# Patient Record
Sex: Male | Born: 2000 | Race: White | Hispanic: No | Marital: Single | Smoking: Never smoker
Health system: Southern US, Community
[De-identification: ages and names within clinical notes are randomized; demographics above are authoritative.]

---
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
## 2018-02-15 ENCOUNTER — Encounter: Payer: BLUE CROSS/BLUE SHIELD | Admitting: Family Medicine

## 2018-03-05 ENCOUNTER — Encounter: Payer: Self-pay | Admitting: Family Medicine

## 2018-03-05 ENCOUNTER — Ambulatory Visit (INDEPENDENT_AMBULATORY_CARE_PROVIDER_SITE_OTHER): Payer: BLUE CROSS/BLUE SHIELD | Admitting: Family Medicine

## 2018-03-05 VITALS — BP 140/62 | HR 69 | Ht 67.75 in | Wt 122.0 lb

## 2018-03-05 DIAGNOSIS — Z0001 Encounter for general adult medical examination with abnormal findings: Secondary | ICD-10-CM | POA: Diagnosis not present

## 2018-03-05 DIAGNOSIS — Z00129 Encounter for routine child health examination without abnormal findings: Secondary | ICD-10-CM

## 2018-03-05 DIAGNOSIS — R1013 Epigastric pain: Secondary | ICD-10-CM

## 2018-03-05 DIAGNOSIS — R110 Nausea: Secondary | ICD-10-CM | POA: Diagnosis not present

## 2018-03-05 DIAGNOSIS — R634 Abnormal weight loss: Secondary | ICD-10-CM | POA: Diagnosis not present

## 2018-03-05 NOTE — Progress Notes (Signed)
Subjective:     History was provided by the mother and father.  Billy Wilkerson is a 17 y.o. male who is here for this wellness visit.    Current Issues: Current concerns include:weight.  He did want to discuss his weight today.  He feels like he is too thin.  He says he eats normally but sometimes does not feel hungry and will skip with another days he is very hungry and will eat a lot.  He has not lost any weight but has not gained either.  He says he has several members in his family that are very slender but he just feels uncomfortable with it.  He wants to know what he could do to increase his weight.  He does also report some occasional nausea and frequent burping.  Sometimes he will wake up with a nausea and then not want to eat breakfast.  He does drink a lot of carbonated beverages.  Occasionally he will have pain between his spine and his right shoulder blade and below that right shoulder blade but really feels like it is more muscular because if he turns or twists his neck he can feel the uncomfortableness of it.  H (Home) Family Relationships: good Communication: good with parents Responsibilities: has responsibilities at home  E (Education): Grades: N/A School: [employer] Future Plans: college - transfer to college in [place].   A (Activities) Sports: no sports Exercise: No Friends: Yes   A (Auton/Safety) Auto: wears seat belt   D (Diet) Diet: balanced diet Risky eating habits: nausae and reflux but does drink alot of soda.  Intake: irregular intake.  Body Image: positive body image, feels he is too thin  Drugs Tobacco: No Alcohol: not asked   Sex Activity: not asked  Suicide Risk Emotions: healthy Depression: denies feelings of depression Suicidal: denies suicidal ideation     Objective:     Vitals:   03/05/18 1333  BP: 140/62  Pulse: 69  SpO2: 99%  Weight: 122 lb (55.3 kg)  Height: 5' 7.75" (1.721 m)   Growth parameters are noted and are appropriate  for age.  General:   alert and cooperative  Gait:   normal  Skin:   normal  Oral cavity:   lips, mucosa, and tongue normal; teeth and gums normal  Eyes:   sclerae white, pupils equal and reactive  Ears:   normal bilaterally  Neck:   normal  Lungs:  clear to auscultation bilaterally  Heart:   regular rate and rhythm, S1, S2 normal, no murmur, click, rub or gallop  Abdomen:  soft, non-tender; bowel sounds normal; no masses,  no organomegaly  GU:  not examined  Extremities:   extremities normal, atraumatic, no cyanosis or edema  Neuro:  normal without focal findings, mental status, speech normal, alert and oriented x3, PERLA and reflexes normal and symmetric     Assessment:    Healthy 17 y.o. male child.    Plan:   1. Anticipatory guidance discussed. Nutrition and Physical activity  2. Follow-up visit in 12 months for next wellness visit, or sooner as needed.    3. Nausea and burping - likely GERD.  Discussed cutting back on soda which he drinks a lot of.  He is Artie cut back on his coffee some.  Avoiding acidic and greasy foods.  Handout provided with additional information.  Recommend a trial of PPI at nighttime since his symptoms been seemed to trigger more in the morning.  4. Abnromal weight loss -  we discussed working this up further by at least doing a CBC to evaluate for abnormal white count and anemia in addition to checking his thyroid though I suspect that this will all be normal.  No significant family history of thyroid disorder.  Recommended a trial of using the smart phone application call my fitness pal to help him actually track calories to make sure that he is getting adequate intake and to may be even set a caloric goal so that he could gain 1-2 pounds per month.  If that is not improving over the summer he is welcome to come back and we can always discuss a low dose of Paxil before he goes to college.  Also discussed current vaccination recommendations.  I would  encourage him to get his meningitis vaccine.  We also discussed hepatitis A but is not required by school and also consider Gardisil.

## 2018-03-05 NOTE — Patient Instructions (Addendum)
My Fitness Pal can help you track calories Can try Prilosec OTC at bedtime for 2 weeks and see if helping your nausea Work on cutting out the soda.    Food Choices for Gastroesophageal Reflux Disease, Adult When you have gastroesophageal reflux disease (GERD), the foods you eat and your eating habits are very important. Choosing the right foods can help ease your discomfort. What guidelines do I need to follow?  Choose fruits, vegetables, whole grains, and low-fat dairy products.  Choose low-fat meat, fish, and poultry.  Limit fats such as oils, salad dressings, butter, nuts, and avocado.  Keep a food diary. This helps you identify foods that cause symptoms.  Avoid foods that cause symptoms. These may be different for everyone.  Eat small meals often instead of 3 large meals a day.  Eat your meals slowly, in a place where you are relaxed.  Limit fried foods.  Cook foods using methods other than frying.  Avoid drinking alcohol.  Avoid drinking large amounts of liquids with your meals.  Avoid bending over or lying down until 2-3 hours after eating. What foods are not recommended? These are some foods and drinks that may make your symptoms worse: Vegetables Tomatoes. Tomato juice. Tomato and spaghetti sauce. Chili peppers. Onion and garlic. Horseradish. Fruits Oranges, grapefruit, and lemon (fruit and juice). Meats High-fat meats, fish, and poultry. This includes hot dogs, ribs, ham, sausage, salami, and bacon. Dairy Whole milk and chocolate milk. Sour cream. Cream. Butter. Ice cream. Cream cheese. Drinks Coffee and tea. Bubbly (carbonated) drinks or energy drinks. Condiments Hot sauce. Barbecue sauce. Sweets/Desserts Chocolate and cocoa. Donuts. Peppermint and spearmint. Fats and Oils High-fat foods. This includes [place] fries and potato chips. Other Vinegar. Strong spices. This includes black pepper, white pepper, red pepper, cayenne, curry powder, cloves, ginger,  and chili powder. The items listed above may not be a complete list of foods and drinks to avoid. Contact your dietitian for more information. This information is not intended to replace advice given to you by your health care provider. Make sure you discuss any questions you have with your health care provider. Document Released: 06/12/2012 Document Revised: 05/19/2016 Document Reviewed: 10/16/2013 Elsevier Interactive Patient Education  2017 [employer]

## 2018-03-06 LAB — CBC
HEMATOCRIT: 45.6 % (ref 36.0–49.0)
Hemoglobin: 16.1 g/dL (ref 12.0–16.9)
MCH: 29 pg (ref 25.0–35.0)
MCHC: 35.3 g/dL (ref 31.0–36.0)
MCV: 82 fL (ref 78.0–98.0)
MPV: 10 fL (ref 7.5–12.5)
Platelets: 271 10*3/uL (ref 140–400)
RBC: 5.56 10*6/uL (ref 4.10–5.70)
RDW: 12.7 % (ref 11.0–15.0)
WBC: 5.9 10*3/uL (ref 4.5–13.0)

## 2018-03-06 LAB — TSH: TSH: 1.11 mIU/L (ref 0.50–4.30)

## 2018-03-06 NOTE — Progress Notes (Signed)
All labs are normal. 

## 2018-03-20 ENCOUNTER — Encounter: Payer: Self-pay | Admitting: Family Medicine

## 2018-03-20 ENCOUNTER — Ambulatory Visit (INDEPENDENT_AMBULATORY_CARE_PROVIDER_SITE_OTHER): Payer: BLUE CROSS/BLUE SHIELD | Admitting: Family Medicine

## 2018-03-20 VITALS — BP 129/52 | HR 67 | Ht 68.0 in | Wt 120.0 lb

## 2018-03-20 DIAGNOSIS — J0130 Acute sphenoidal sinusitis, unspecified: Secondary | ICD-10-CM

## 2018-03-20 MED ORDER — AMOXICILLIN-POT CLAVULANATE 875-125 MG PO TABS: 1.0000 | ORAL_TABLET | Freq: Two times a day (BID) | ORAL | 0 refills | Status: DC

## 2018-03-20 NOTE — Progress Notes (Signed)
   Subjective:    Patient ID: Billy Wilkerson, male    DOB: 09/25/2001, 17 y.o.   MRN: [id number]  HPI 17 year old male is here today complaining of 1 week of symptoms including sore throat, facial pressure.  He initially had a low-grade temperature last Tuesday around 99.4.  He is been using Sudafed and waking up with a dry throat.  He is feeling some better though in regards to the sore throat and headache but now he just feels very congested and having pain over the nasal bridge. His voice is very raspy and he did not stop the Flonase because he says it was causing his throat to get irritated.  Review of Systems     Objective:   Physical Exam  Constitutional: He is oriented to person, place, and time. He appears well-developed and well-nourished.  HENT:  Head: Normocephalic and atraumatic.  Right Ear: External ear normal.  Left Ear: External ear normal.  Nose: Nose normal.  Mouth/Throat: Oropharynx is clear and moist.  TMs and canals are clear.   Eyes: Pupils are equal, round, and reactive to light. Conjunctivae and EOM are normal.  Neck: Neck supple. No thyromegaly present.  Cardiovascular: Normal rate and normal heart sounds.  Pulmonary/Chest: Effort normal and breath sounds normal.  Lymphadenopathy:    He has no cervical adenopathy.  Neurological: He is alert and oriented to person, place, and time.  Skin: Skin is warm and dry.  Psychiatric: He has a normal mood and affect.          Assessment & Plan:  Sinusitis, acute -treat with Augmentin.  It sounds like it was likely a viral infection and now has turned more into a sinus infection.  If not significantly better in 1 week then please give us a call back.  Can certainly use an oral antihistamine instead of the Flonase.  Though if the Flonase is hitting the back of his throat then he is probably not tilting his head forward and enough and I briefly reviewed technique again with him.

## 2018-03-20 NOTE — Patient Instructions (Signed)

## 2019-09-16 ENCOUNTER — Telehealth: Payer: Self-pay

## 2019-09-16 NOTE — Telephone Encounter (Signed)
Billy Wilkerson's mom called to ask if he could take ibuprofen. He has COVID-19 and is on day 13. No fever, he does report body aches, sinus pressure and congestion. Please advise.

## 2019-09-16 NOTE — Telephone Encounter (Signed)
Yes, he can take ibuprofen and or Tylenol or even alternate if needed.

## 2019-09-17 MED ORDER — FLUTICASONE PROPIONATE 50 MCG/ACT NA SUSP: 1.0000 | Freq: Every day | NASAL | 5 refills | Status: DC

## 2019-09-17 NOTE — Telephone Encounter (Signed)
Billy Wilkerson's mom is requesting a refill on Flonase. Historical provider.

## 2019-09-17 NOTE — Telephone Encounter (Signed)
Patient's mom advised 

## 2019-09-17 NOTE — Telephone Encounter (Signed)
rx sent

## 2020-04-15 ENCOUNTER — Other Ambulatory Visit: Payer: Self-pay

## 2020-04-15 MED ORDER — FLUTICASONE PROPIONATE 50 MCG/ACT NA SUSP: 1.0000 | Freq: Every day | NASAL | 1 refills | Status: DC

## 2020-04-16 ENCOUNTER — Telehealth: Payer: BLUE CROSS/BLUE SHIELD | Admitting: Nurse Practitioner

## 2020-04-28 ENCOUNTER — Other Ambulatory Visit: Payer: Self-pay

## 2020-04-28 ENCOUNTER — Encounter: Payer: Self-pay | Admitting: Family Medicine

## 2020-04-28 ENCOUNTER — Telehealth (INDEPENDENT_AMBULATORY_CARE_PROVIDER_SITE_OTHER): Payer: BC Managed Care – PPO | Admitting: Family Medicine

## 2020-04-28 VITALS — Ht 68.0 in | Wt 124.0 lb

## 2020-04-28 DIAGNOSIS — J069 Acute upper respiratory infection, unspecified: Secondary | ICD-10-CM

## 2020-04-28 DIAGNOSIS — R059 Cough, unspecified: Secondary | ICD-10-CM

## 2020-04-28 DIAGNOSIS — R05 Cough: Secondary | ICD-10-CM

## 2020-04-28 DIAGNOSIS — R0602 Shortness of breath: Secondary | ICD-10-CM

## 2020-04-28 NOTE — Progress Notes (Signed)
Virtual Visit via Video Note  I connected with Billy Wilkerson on 04/28/20 at  3:20 PM EDT by a video enabled telemedicine application and verified that I am speaking with the correct person using two identifiers.   I discussed the limitations of evaluation and management by telemedicine and the availability of in person appointments. The patient expressed understanding and agreed to proceed.  Subjective:    CC: Cough  HPI:  Billy Wilkerson is a 19 year old male with no prior history of respiratory problems who complains of sinus symptoms and cough for about 2 weeks.  He says about 2 weeks ago he started noticing some sinus drainage mild congestion and just itchy irritated scratchy throat.  Then about a week ago he developed a cough.  He feels like the cough is mostly in the upper respiratory airway does not feel like it is deep in his chest.  He denies any fevers Chills or sweats.  He did go get tested for strep and Covid and it was negative.  He did feel like initially it was may be more allergy related he did start some Flonase but says it actually made his cough worse.  And he has been taking Claritin though he did miss a couple days of it because he ran out.  He also reports he is been taking some Delsym as well Sudafed.  He also wanted to let me know that he did have Covid in September.  About 4 to 5 days after he was diagnosed he actually had increased shortness of breath and went to the emergency department.  He was given an albuterol inhaler there and he says he has been using it periodically in fact he used it yesterday because he was coughing so much he actually felt short of breath.  He says that ever since he had Covid he is just noticed he is felt more short of breath in general than usual no prior history of asthma as a child  Past medical history, Surgical history, Family history not pertinant except as noted below, Social history, Allergies, and medications have been entered into the medical  record, reviewed, and corrections made.   Review of Systems: No fevers, chills, night sweats, weight loss, chest pain, or shortness of breath.   Objective:    General: Speaking clearly in complete sentences without any shortness of breath.  Alert and oriented x3.  Normal judgment. No apparent acute distress.    Impression and Recommendations:    No problem-specific Assessment & Plan notes found for this encounter.  Cough-I feel like it secondary to either upper respiratory infection and or allergies.  He actually feels like it is gradually gotten a little bit better and in the last couple days it has improved slightly.  So we discussed continuing with his current regimen of oral antihistamine.  I reviewed how to properly use a nasal steroid spray.  He was actually tilting his head backwards which was what was causing the cough.  Discussed how to do the a chin tuck and administer the medication.  URI-also consider may be a viral syndrome.  Should improve in the next week.  If he feels like he is suddenly getting worse or just not improving then consider treating for bacterial sinusitis.  Shortness of breath since having had Covid in September.  I discussed with him getting him set up for spirometry for further evaluation once he is over his current respiratory illness.   Time spent in encounter 22 minutes  I  discussed the assessment and treatment plan with the patient. The patient was provided an opportunity to ask questions and all were answered. The patient agreed with the plan and demonstrated an understanding of the instructions.   The patient was advised to call back or seek an in-person evaluation if the symptoms worsen or if the condition fails to improve as anticipated.   Nani Gasser, MD

## 2020-04-28 NOTE — Progress Notes (Signed)
Pt reports that he was taking delsym at night. He said he doesn't have it that often and this has gotten better over the last week. He reports that the cough is worse in the mornings and at night.  He reports that he stopped using the Flonase a few days ago due to it causing his cough to be worse.   He was tested a couple of weeks ago for covid,flu and strep and was negative for all three.

## 2020-04-29 ENCOUNTER — Telehealth: Payer: BLUE CROSS/BLUE SHIELD | Admitting: Medical-Surgical

## 2020-05-07 ENCOUNTER — Ambulatory Visit (INDEPENDENT_AMBULATORY_CARE_PROVIDER_SITE_OTHER): Payer: BC Managed Care – PPO | Admitting: Family Medicine

## 2020-05-07 ENCOUNTER — Other Ambulatory Visit: Payer: Self-pay

## 2020-05-07 ENCOUNTER — Encounter: Payer: Self-pay | Admitting: Family Medicine

## 2020-05-07 VITALS — BP 125/71 | HR 83 | Ht 68.0 in | Wt 128.0 lb

## 2020-05-07 DIAGNOSIS — K120 Recurrent oral aphthae: Secondary | ICD-10-CM

## 2020-05-07 DIAGNOSIS — Z0000 Encounter for general adult medical examination without abnormal findings: Secondary | ICD-10-CM | POA: Diagnosis not present

## 2020-05-07 LAB — LIPID PANEL
Cholesterol: 181 mg/dL (ref <200)
HDL: 54 mg/dL (ref >=40)
LDL Cholesterol (Calc): 111 mg/dL (calc) — ABNORMAL HIGH
Non-HDL Cholesterol (Calc): 127 mg/dL (calc) (ref <130)
Total CHOL/HDL Ratio: 3.4 (calc) (ref <5.0)
Triglycerides: 73 mg/dL (ref <150)

## 2020-05-07 LAB — COMPLETE METABOLIC PANEL WITH GFR
AG Ratio: 1.8 (calc) (ref 1.0–2.5)
ALT: 57 U/L — ABNORMAL HIGH (ref 9–46)
AST: 45 U/L — ABNORMAL HIGH (ref 10–40)
Albumin: 4.9 g/dL (ref 3.6–5.1)
Alkaline phosphatase (APISO): 61 U/L (ref 36–130)
BUN: 13 mg/dL (ref 7–25)
CO2: 31 mmol/L (ref 20–32)
Calcium: 9.8 mg/dL (ref 8.6–10.3)
Chloride: 101 mmol/L (ref 98–110)
Creat: 0.82 mg/dL (ref 0.60–1.35)
GFR, Est African American: 148 mL/min/{1.73_m2} (ref >=60)
GFR, Est Non African American: 127 mL/min/{1.73_m2} (ref >=60)
Globulin: 2.8 g/dL (calc) (ref 1.9–3.7)
Glucose, Bld: 92 mg/dL (ref 65–139)
Potassium: 4.5 mmol/L (ref 3.5–5.3)
Sodium: 138 mmol/L (ref 135–146)
Total Bilirubin: 0.7 mg/dL (ref 0.2–1.2)
Total Protein: 7.7 g/dL (ref 6.1–8.1)

## 2020-05-07 LAB — CBC
HCT: 47.5 % (ref 38.5–50.0)
Hemoglobin: 16.3 g/dL (ref 13.2–17.1)
MCH: 28.6 pg (ref 27.0–33.0)
MCHC: 34.3 g/dL (ref 32.0–36.0)
MCV: 83.5 fL (ref 80.0–100.0)
MPV: 9.7 fL (ref 7.5–12.5)
Platelets: 315 10*3/uL (ref 140–400)
RBC: 5.69 10*6/uL (ref 4.20–5.80)
RDW: 12.6 % (ref 11.0–15.0)
WBC: 4.9 10*3/uL (ref 3.8–10.8)

## 2020-05-07 NOTE — Patient Instructions (Addendum)
Recommend a trial of lysine 500 mg daily to help with the mouth ulcers.       Preventive Care 58-21 Years Old, Male Preventive care refers to lifestyle choices and visits with your health care provider that can promote health and wellness. At this stage in your life, you may start seeing a primary care physician instead of a pediatrician. Your health care is now your responsibility. Preventive care for young adults includes:  A yearly physical exam. This is also called an annual wellness visit.  Regular dental and eye exams.  Immunizations.  Screening for certain conditions.  Healthy lifestyle choices, such as diet and exercise. What can I expect for my preventive care visit? Physical exam Your health care provider may check:  Height and weight. These may be used to calculate body mass index (BMI), which is a measurement that tells if you are at a healthy weight.  Heart rate and blood pressure.  Body temperature. Counseling Your health care provider may ask you questions about:  Past medical problems and family medical history.  Alcohol, tobacco, and drug use.  Home and relationship well-being.  Access to firearms.  Emotional well-being.  Diet, exercise, and sleep habits.  Sexual activity and sexual health. What immunizations do I need?  Influenza (flu) vaccine  This is recommended every year. Tetanus, diphtheria, and pertussis (Tdap) vaccine  You may need a Td booster every 10 years. Varicella (chickenpox) vaccine  You may need this vaccine if you have not already been vaccinated. Human papillomavirus (HPV) vaccine  If recommended by your health care provider, you may need three doses over 6 months. Measles, mumps, and rubella (MMR) vaccine  You may need at least one dose of MMR. You may also need a second dose. Meningococcal conjugate (MenACWY) vaccine  One dose is recommended if you are 3-47 years old and a Market researcher living in a  residence hall, or if you have one of several medical conditions. You may also need additional booster doses. Pneumococcal conjugate (PCV13) vaccine  You may need this if you have certain conditions and were not previously vaccinated. Pneumococcal polysaccharide (PPSV23) vaccine  You may need one or two doses if you smoke cigarettes or if you have certain conditions. Hepatitis A vaccine  You may need this if you have certain conditions or if you travel or work in places where you may be exposed to hepatitis A. Hepatitis B vaccine  You may need this if you have certain conditions or if you travel or work in places where you may be exposed to hepatitis B. Haemophilus influenzae type b (Hib) vaccine  You may need this if you have certain risk factors. You may receive vaccines as individual doses or as more than one vaccine together in one shot (combination vaccines). Talk with your health care provider about the risks and benefits of combination vaccines. What tests do I need? Blood tests  Lipid and cholesterol levels. These may be checked every 5 years starting at age 85.  Hepatitis C test.  Hepatitis B test. Screening  Genital exam to check for testicular cancer or hernias.  Sexually transmitted disease (STD) testing, if you are at risk. Other tests  Tuberculosis skin test.  Vision and hearing tests.  Skin exam. Follow these instructions at home: Eating and drinking   Eat a diet that includes fresh fruits and vegetables, whole grains, lean protein, and low-fat dairy products.  Drink enough fluid to keep your urine pale yellow.  Do not  drink alcohol if: ? Your health care provider tells you not to drink. ? You are under the legal drinking age. In the [place] the legal drinking age is 37.  If you drink alcohol: ? Limit how much you have to 0-2 drinks a day. ? Be aware of how much alcohol is in your drink. In the [place] one drink equals one 12 oz bottle of beer (355 mL),  one 5 oz glass of wine (148 mL), or one 1 oz glass of hard liquor (44 mL). Lifestyle  Take daily care of your teeth and gums.  Stay active. Exercise at least 30 minutes 5 or more days of the week.  Do not use any products that contain nicotine or tobacco, such as cigarettes, e-cigarettes, and chewing tobacco. If you need help quitting, ask your health care provider.  Do not use drugs.  If you are sexually active, practice safe sex. Use a condom or other form of protection to prevent STIs (sexually transmitted infections).  Find healthy ways to cope with stress, such as: ? Meditation, yoga, or listening to music. ? Journaling. ? Talking to a trusted person. ? Spending time with friends and family. Safety  Always wear your seat belt while driving or riding in a vehicle.  Do not drive if you have been drinking alcohol.  Do not ride with someone who has been drinking.  Do not drive when you are tired or distracted.  Do not text while driving.  Wear a helmet and other protective equipment during sports activities.  If you have firearms in your house, make sure you follow all gun safety procedures.  Seek help if you have been bullied, physically abused, or sexually abused.  Use the Internet responsibly to avoid dangers such as online bullying and online sex predators. What's next?  Go to your health care provider once a year for a well check visit.  Ask your health care provider how often you should have your eyes and teeth checked.  Stay up to date on all vaccines. This information is not intended to replace advice given to you by your health care provider. Make sure you discuss any questions you have with your health care provider. Document Revised: 12/06/2018 Document Reviewed: 12/06/2018 Elsevier Patient Education  2020 [employer].

## 2020-05-07 NOTE — Assessment & Plan Note (Signed)
Recommend trial of Lysine 500mg  QD.

## 2020-05-07 NOTE — Progress Notes (Signed)
CPE  Established Patient Office Visit  Subjective:  Patient ID: Billy Wilkerson, male    DOB: 04-Nov-2001  Age: 19 y.o. MRN: [id number]  CC:  Chief Complaint  Patient presents with  . Annual Exam    HPI Billy Wilkerson presents for CPE. He is doing well.  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  Would like to get the COVID vaccine.  Gets recurrent aphthous ulcers every few weeks.  Says usually on has 1 at a time.    History reviewed. No pertinent past medical history.  History reviewed. No pertinent surgical history.  History reviewed. No pertinent family history.  Social History   Socioeconomic History  . Marital status: Single    Spouse name: Not on file  . Number of children: Not on file  . Years of education: Not on file  . Highest education level: Not on file  Occupational History  . Not on file  Tobacco Use  . Smoking status: Never Smoker  . Smokeless tobacco: Never Used  Substance and Sexual Activity  . Alcohol use: Not on file  . Drug use: Not on file  . Sexual activity: Not on file  Other Topics Concern  . Not on file  Social History Narrative  . Not on file   Social Determinants of Health   Financial Resource Strain:   . Difficulty of Paying Living Expenses:   Food Insecurity:   . Worried About Charity fundraiser in the Last Year:   . Arboriculturist in the Last Year:   Transportation Needs:   . Film/video editor (Medical):   Marland Kitchen Lack of Transportation (Non-Medical):   Physical Activity:   . Days of Exercise per Week:   . Minutes of Exercise per Session:   Stress:   . Feeling of Stress :   Social Connections:   . Frequency of Communication with Friends and Family:   . Frequency of Social Gatherings with Friends and Family:   . Attends Religious Services:   . Active Member of Clubs or Organizations:   . Attends Archivist Meetings:   Marland Kitchen Marital Status:   Intimate Partner Violence:   . Fear of  Current or Ex-Partner:   . Emotionally Abused:   Marland Kitchen Physically Abused:   . Sexually Abused:     Outpatient Medications Prior to Visit  Medication Sig Dispense Refill  . fluticasone (FLONASE) 50 MCG/ACT nasal spray Place 1 spray into both nostrils daily. 18.2 mL 1   No facility-administered medications prior to visit.    No Known Allergies  ROS Review of Systems    Objective:    Physical Exam  Constitutional: He is oriented to person, place, and time. He appears well-developed and well-nourished.  HENT:  Head: Normocephalic and atraumatic.  Right Ear: External ear normal.  Left Ear: External ear normal.  Nose: Nose normal.  Mouth/Throat: Oropharynx is clear and moist.  Eyes: Pupils are equal, round, and reactive to light. Conjunctivae and EOM are normal.  Neck: No thyromegaly present.  Cardiovascular: Normal rate, regular rhythm, normal heart sounds and intact distal pulses.  Pulmonary/Chest: Effort normal and breath sounds normal.  Abdominal: Soft. Bowel sounds are normal. He exhibits no distension and no mass. There is no abdominal tenderness. There is no rebound and no guarding.  Musculoskeletal:        General: Normal range of motion.     Cervical back: Normal range of motion  and neck supple.  Lymphadenopathy:    He has no cervical adenopathy.  Neurological: He is alert and oriented to person, place, and time. He has normal reflexes.  Skin: Skin is warm and dry.  Psychiatric: He has a normal mood and affect. His behavior is normal. Judgment and thought content normal.    BP 125/71   Pulse 83   Ht '5\' 8"'  (1.727 m)   Wt 128 lb (58.1 kg)   SpO2 100%   BMI 19.46 kg/m  Wt Readings from Last 3 Encounters:  05/07/20 128 lb (58.1 kg)  04/28/20 124 lb (56.2 kg)  03/20/18 120 lb (54.4 kg) (5 %, Z= -1.62)*   * Growth percentiles are based on CDC (Boys, 2-20 Years) data.     There are no preventive care reminders to display for this patient.  There are no preventive  care reminders to display for this patient.  Lab Results  Component Value Date   TSH 1.11 03/05/2018   Lab Results  Component Value Date   WBC 5.9 03/05/2018   HGB 16.1 03/05/2018   HCT 45.6 03/05/2018   MCV 82.0 03/05/2018   PLT 271 03/05/2018   No results found for: NA, K, CHLORIDE, CO2, GLUCOSE, BUN, CREATININE, BILITOT, ALKPHOS, AST, ALT, PROT, ALBUMIN, CALCIUM, ANIONGAP, EGFR, GFR No results found for: CHOL No results found for: HDL No results found for: LDLCALC No results found for: TRIG No results found for: CHOLHDL No results found for: HGBA1C    Assessment & Plan:   Problem List Items Addressed This Visit      Other   Aphthous ulcer    Recommend trial of Lysine 5109m QD.         Other Visit Diagnoses    Wellness examination    -  Primary   Relevant Orders   COMPLETE METABOLIC PANEL WITH GFR   Lipid panel   CBC     Keep up a regular exercise program and make sure you are eating a healthy diet Try to eat 4 servings of dairy a day, or if you are lactose intolerant take a calcium with vitamin D daily.  Your vaccines are up to date.  Given info for walk-in for COVID vaccines.    No orders of the defined types were placed in this encounter.   Follow-up: Return in about 1 year (around 05/07/2021) for Wellness Exam.    CBeatrice Lecher MD

## 2020-05-08 ENCOUNTER — Encounter: Payer: Self-pay | Admitting: Family Medicine

## 2020-05-08 DIAGNOSIS — R748 Abnormal levels of other serum enzymes: Secondary | ICD-10-CM

## 2020-05-08 NOTE — Telephone Encounter (Signed)
Routing to provider/medical assistant.  

## 2020-05-08 NOTE — Telephone Encounter (Signed)
Routing to provider  

## 2020-05-11 NOTE — Telephone Encounter (Signed)
Per labs "Your metabolic panel looks okay except your liver enzymes are mildly elevated. This is a little bit unusual but can be caused by a viral illness or if you have been taking a lot of Tylenol or drink alcohol. Lets recheck your enzymes in about 2 weeks I want to definitely do that before you leave town. [employer] ordered

## 2020-05-14 ENCOUNTER — Ambulatory Visit (INDEPENDENT_AMBULATORY_CARE_PROVIDER_SITE_OTHER): Payer: BC Managed Care – PPO | Admitting: Family Medicine

## 2020-05-14 ENCOUNTER — Other Ambulatory Visit: Payer: Self-pay

## 2020-05-14 ENCOUNTER — Encounter: Payer: Self-pay | Admitting: Family Medicine

## 2020-05-14 VITALS — BP 131/74 | HR 64 | Ht 68.0 in | Wt 128.0 lb

## 2020-05-14 DIAGNOSIS — R0602 Shortness of breath: Secondary | ICD-10-CM

## 2020-05-14 MED ORDER — ALBUTEROL SULFATE HFA 108 (90 BASE) MCG/ACT IN AERS
2.0000 | INHALATION_SPRAY | Freq: Once | RESPIRATORY_TRACT | Status: AC
  Administered 2020-05-14: 2 via RESPIRATORY_TRACT

## 2020-05-14 NOTE — Progress Notes (Signed)
Established Patient Office Visit  Subjective:  Patient ID: Billy Wilkerson, male    DOB: [birth year]  Age: 19 y.o. MRN: [id number]  CC:  Chief Complaint  Patient presents with  . Shortness of Breath    HPI Billy Wilkerson presents for f/U SOB.  Had notice more SOB since had COVID back in September.  He reports that he did have a chest x-ray done right after he got a quarantine because he went hiking and felt really short of breath and ended up going to the emergency room says he was told it was normal and given albuterol inhaler to use as needed.  Ever since then he just noticed a little bit of increase shortness of breath with activity but nothing severe.  He occasionally will use the albuterol that he was given.  He is here today for spirometry  No past medical history on file.  No past surgical history on file.  No family history on file.  Social History   Socioeconomic History  . Marital status: Single    Spouse name: Not on file  . Number of children: Not on file  . Years of education: Not on file  . Highest education level: Not on file  Occupational History  . Not on file  Tobacco Use  . Smoking status: Never Smoker  . Smokeless tobacco: Never Used  Substance and Sexual Activity  . Alcohol use: Not on file  . Drug use: Not on file  . Sexual activity: Not on file  Other Topics Concern  . Not on file  Social History Narrative  . Not on file   Social Determinants of Health   Financial Resource Strain:   . Difficulty of Paying Living Expenses:   Food Insecurity:   . Worried About Programme researcher, broadcasting/film/video in the Last Year:   . Barista in the Last Year:   Transportation Needs:   . Freight forwarder (Medical):   Marland Kitchen Lack of Transportation (Non-Medical):   Physical Activity:   . Days of Exercise per Week:   . Minutes of Exercise per Session:   Stress:   . Feeling of Stress :   Social Connections:   . Frequency of Communication with Friends and  Family:   . Frequency of Social Gatherings with Friends and Family:   . Attends Religious Services:   . Active Member of Clubs or Organizations:   . Attends Banker Meetings:   Marland Kitchen Marital Status:   Intimate Partner Violence:   . Fear of Current or Ex-Partner:   . Emotionally Abused:   Marland Kitchen Physically Abused:   . Sexually Abused:     Outpatient Medications Prior to Visit  Medication Sig Dispense Refill  . albuterol (VENTOLIN HFA) 108 (90 Base) MCG/ACT inhaler Inhale into the lungs every 6 (six) hours as needed for wheezing or shortness of breath.    . fluticasone (FLONASE) 50 MCG/ACT nasal spray Place 1 spray into both nostrils daily. 18.2 mL 1   No facility-administered medications prior to visit.    No Known Allergies  ROS Review of Systems    Objective:    Physical Exam  Constitutional: He is oriented to person, place, and time. He appears well-developed and well-nourished.  HENT:  Head: Normocephalic and atraumatic.  Eyes: Conjunctivae and EOM are normal.  Cardiovascular: Normal rate.  Pulmonary/Chest: Effort normal.  Neurological: He is alert and oriented to person, place, and time.  Skin: Skin is dry. No  pallor.  Psychiatric: He has a normal mood and affect. His behavior is normal.  Vitals reviewed.   BP 131/74   Pulse 64   Ht 5\' 8"  (1.727 m)   Wt 128 lb (58.1 kg)   SpO2 100%   BMI 19.46 kg/m  Wt Readings from Last 3 Encounters:  05/14/20 128 lb (58.1 kg)  05/07/20 128 lb (58.1 kg)  04/28/20 124 lb (56.2 kg)     There are no preventive care reminders to display for this patient.  There are no preventive care reminders to display for this patient.  Lab Results  Component Value Date   TSH 1.11 03/05/2018   Lab Results  Component Value Date   WBC 4.9 05/07/2020   HGB 16.3 05/07/2020   HCT 47.5 05/07/2020   MCV 83.5 05/07/2020   PLT 315 05/07/2020   Lab Results  Component Value Date   NA 138 05/07/2020   K 4.5 05/07/2020   CO2 31  05/07/2020   GLUCOSE 92 05/07/2020   BUN 13 05/07/2020   CREATININE 0.82 05/07/2020   BILITOT 0.7 05/07/2020   AST 45 (H) 05/07/2020   ALT 57 (H) 05/07/2020   PROT 7.7 05/07/2020   CALCIUM 9.8 05/07/2020   Lab Results  Component Value Date   CHOL 181 05/07/2020   Lab Results  Component Value Date   HDL 54 05/07/2020   Lab Results  Component Value Date   LDLCALC 111 (H) 05/07/2020   Lab Results  Component Value Date   TRIG 73 05/07/2020   Lab Results  Component Value Date   CHOLHDL 3.4 05/07/2020   No results found for: HGBA1C    Assessment & Plan:   Problem List Items Addressed This Visit      Other   Shortness of breath - Primary    Shortness of breath secondary to Covid infection back in September 2020.  We did have him come in to do spirometry today for further evaluation he does get some improvement with use of albuterol subjectively.  Today FVC of 110%, FEV1 of 91% with a ratio of 69.5.  No significant improvement in FEV1 after albuterol but he did have a 14% increase in FVC after albuterol.  We reviewed those results together today.  Indicating some possible mild obstruction though overall his numbers are within the acceptable range.  Continue just as needed albuterol use and I like to consider repeating his test in 6 to 12 months.  He did have a chest x-ray done about 2 weeks after being diagnosed with Covid and it was normal per his report.  So we will hold off on any additional imaging at this point but certainly if he feels like things change or progress or getting worse we can always get additional imaging and possibly even lung CT.      Relevant Orders   PR EVAL OF BRONCHOSPASM      Meds ordered this encounter  Medications  . albuterol (VENTOLIN HFA) 108 (90 Base) MCG/ACT inhaler 2 puff    Follow-up: No follow-ups on file.    Beatrice Lecher, MD

## 2020-05-14 NOTE — Progress Notes (Signed)
Spirometry performed.

## 2020-05-14 NOTE — Assessment & Plan Note (Signed)
Shortness of breath secondary to Covid infection back in September 2020.  We did have him come in to do spirometry today for further evaluation he does get some improvement with use of albuterol subjectively.  Today FVC of 110%, FEV1 of 91% with a ratio of 69.5.  No significant improvement in FEV1 after albuterol but he did have a 14% increase in FVC after albuterol.  We reviewed those results together today.  Indicating some possible mild obstruction though overall his numbers are within the acceptable range.  Continue just as needed albuterol use and I like to consider repeating his test in 6 to 12 months.  He did have a chest x-ray done about 2 weeks after being diagnosed with Covid and it was normal per his report.  So we will hold off on any additional imaging at this point but certainly if he feels like things change or progress or getting worse we can always get additional imaging and possibly even lung CT.

## 2021-02-06 ENCOUNTER — Encounter: Payer: Self-pay | Admitting: Family Medicine

## 2021-02-09 ENCOUNTER — Telehealth (INDEPENDENT_AMBULATORY_CARE_PROVIDER_SITE_OTHER): Payer: BC Managed Care – PPO | Admitting: Family Medicine

## 2021-02-09 ENCOUNTER — Encounter: Payer: Self-pay | Admitting: Family Medicine

## 2021-02-09 DIAGNOSIS — J019 Acute sinusitis, unspecified: Secondary | ICD-10-CM | POA: Diagnosis not present

## 2021-02-09 MED ORDER — AMOXICILLIN-POT CLAVULANATE 875-125 MG PO TABS: 1.0000 | ORAL_TABLET | Freq: Two times a day (BID) | ORAL | 0 refills | Status: DC

## 2021-02-09 NOTE — Progress Notes (Signed)
Virtual Visit via Video Note  I connected with Billy Wilkerson on 02/09/21 at 10:10 AM EST by a video enabled telemedicine application and verified that I am speaking with the correct person using two identifiers.   I discussed the limitations of evaluation and management by telemedicine and the availability of in person appointments. The patient expressed understanding and agreed to proceed.  Patient location: At home Provider location: in office  Subjective:    CC: URI sxs  HPI:  Pt reports that his sxs have been on going since christmas. He took a COVID test on Saturday and Monday and both were Negative. Says will feel better for a week and then feel sick again. Started again Friday night about 5 days ago.   He denies f/s/c/n/v/d/body aches/headaches.   He has had bilateral ear pressure x 1 wk dry scratchy throat, no cough, sometimes a stuffy nose that goes to a runny nose. Did try some clariting but didn't really help. Uses Sudafed PRN.  No facial pain. No HA.  Pressure behind the eyes.    He has been taking nyquil and sudafed no color .   No sick contacts  Past medical history, Surgical history, Family history not pertinant except as noted below, Social history, Allergies, and medications have been entered into the medical record, reviewed, and corrections made.   Review of Systems: No fevers, chills, night sweats, weight loss, chest pain, or shortness of breath.   Objective:    General: Speaking clearly in complete sentences without any shortness of breath.  Alert and oriented x3.  Normal judgment. No apparent acute distress.    Impression and Recommendations:    No problem-specific Assessment & Plan notes found for this encounter.  Acute sinusitis -it is difficult to say if he may have had some viral illnesses that resolved and then recurred or improved or possible secondary bacterial infection but at this point he has been sick on and off for about 7 weeks I  think it is reasonable to do a trial of an antibiotic for acute sinusitis symptoms symptoms returned again about 5 days ago.  Also recommend a trial of a nasal steroid spray since he had already tried an oral antihistamine and it did not provide relief he does report he has year-round allergies.  If not improving over the next couple of weeks then please let me know.    Time spent in encounter 21 minutes  I discussed the assessment and treatment plan with the patient. The patient was provided an opportunity to ask questions and all were answered. The patient agreed with the plan and demonstrated an understanding of the instructions.   The patient was advised to call back or seek an in-person evaluation if the symptoms worsen or if the condition fails to improve as anticipated.   Nani Gasser, MD

## 2021-02-09 NOTE — Progress Notes (Signed)
Pt reports that his sxs have been on going since christmas. He took a COVID test on Saturday and Monday and both were Negative.   He denies f/s/c/n/v/d/body aches/headaches.   He has had ear pressure x 1 wk dry scratchy throat, no cough, sometimes a stuffy nose that goes to a runny nose.   He has been taking nyquil and sudafed no color .   No sick contacts

## 2021-07-09 ENCOUNTER — Encounter: Payer: Self-pay | Admitting: Family Medicine

## 2021-07-09 ENCOUNTER — Other Ambulatory Visit: Payer: Self-pay

## 2021-07-09 ENCOUNTER — Telehealth (INDEPENDENT_AMBULATORY_CARE_PROVIDER_SITE_OTHER): Payer: BC Managed Care – PPO | Admitting: Family Medicine

## 2021-07-09 DIAGNOSIS — J0100 Acute maxillary sinusitis, unspecified: Secondary | ICD-10-CM

## 2021-07-09 MED ORDER — AMOXICILLIN-POT CLAVULANATE 875-125 MG PO TABS: 1.0000 | ORAL_TABLET | Freq: Two times a day (BID) | ORAL | 0 refills | Status: AC

## 2021-07-09 MED ORDER — FLUTICASONE PROPIONATE 50 MCG/ACT NA SUSP: 2.0000 | Freq: Every day | NASAL | 0 refills | Status: DC

## 2021-07-09 NOTE — Progress Notes (Signed)
Virtual Video Visit via MyChart Note  I connected with  Billy Wilkerson on 07/09/21 at  8:50 AM EDT by the video enabled telemedicine application for MyChart, and verified that I am speaking with the correct person using two identifiers.   I introduced myself as a Publishing rights manager with the practice. We discussed the limitations of evaluation and management by telemedicine and the availability of in person appointments. The patient expressed understanding and agreed to proceed.  Participating parties in this visit include: The patient and the nurse practitioner listed.  The patient is:In the car I am: In the office - [facility]  Subjective:    CC:  Chief Complaint  Patient presents with   Sinusitis    HPI: Billy Wilkerson is a 20 y.o. year old male presenting today via MyChart today for sinus infection.  Patient reports he is going on almost 2 weeks of sinus pressure, nasal congestion, ear pressure, occasional cough related to clearing his throat from drainage.  Reports the sinus pressure is mostly maxillary and can be 5-9/10 on pain scale.  Denies any ear pain but does report pressure and muffled hearing with the congestion.  He has had minimal relief with DayQuil and NyQuil.  He has not had any fever, chills, body aches, fatigue, trouble breathing, GI or GU symptoms.  He has not tested for COVID, but is now out of the window.    Past medical history, Surgical history, Family history not pertinant except as noted below, Social history, Allergies, and medications have been entered into the medical record, reviewed, and corrections made.   Review of Systems:  All review of systems negative except what is listed in the HPI   Objective:    General:  Speaking clearly in complete sentences. Absent shortness of breath noted.   Alert and oriented x3.   Normal judgment.  Absent acute distress.   Impression and Recommendations:    1. Acute non-recurrent  maxillary sinusitis  Given duration, treating with Augmentin and recommending Flonase. Continue OTC analgesics, rest, hydration, warm compresses, humidifier use, warm liquids, etc. Patient aware of signs/symptoms requiring further/urgent evaluation.  - amoxicillin-clavulanate (AUGMENTIN) 875-125 MG tablet; Take 1 tablet by mouth 2 (two) times daily for 7 days.  Dispense: 14 tablet; Refill: 0 - fluticasone (FLONASE) 50 MCG/ACT nasal spray; Place 2 sprays into both nostrils daily.  Dispense: 1 g; Refill: 0   Follow-up if symptoms worsen or fail to improve.    I discussed the assessment and treatment plan with the patient. The patient was provided an opportunity to ask questions and all were answered. The patient agreed with the plan and demonstrated an understanding of the instructions.   The patient was advised to call back or seek an in-person evaluation if the symptoms worsen or if the condition fails to improve as anticipated.  I provided 20 minutes of non-face-to-face interaction with this MYCHART visit including intake, same-day documentation, and chart review.   Lollie Marrow Reola Calkins, DNP, FNP-C

## 2022-05-24 ENCOUNTER — Ambulatory Visit: Payer: BC Managed Care – PPO | Admitting: Family Medicine

## 2022-05-30 ENCOUNTER — Telehealth (INDEPENDENT_AMBULATORY_CARE_PROVIDER_SITE_OTHER): Payer: BC Managed Care – PPO | Admitting: Family Medicine

## 2022-05-30 ENCOUNTER — Encounter: Payer: Self-pay | Admitting: Family Medicine

## 2022-05-30 VITALS — Ht 68.0 in | Wt 131.0 lb

## 2022-05-30 DIAGNOSIS — R110 Nausea: Secondary | ICD-10-CM | POA: Diagnosis not present

## 2022-05-30 DIAGNOSIS — R630 Anorexia: Secondary | ICD-10-CM | POA: Diagnosis not present

## 2022-05-30 DIAGNOSIS — K5900 Constipation, unspecified: Secondary | ICD-10-CM | POA: Diagnosis not present

## 2022-05-30 MED ORDER — PANTOPRAZOLE SODIUM 40 MG PO TBEC: 40.0000 mg | DELAYED_RELEASE_TABLET | Freq: Every day | ORAL | 1 refills | Status: DC

## 2022-05-30 NOTE — Progress Notes (Signed)
Pt reports that he has been dealing with 3-4 months of stomach pain this happens more days than not.  Gas pain that turns into constipation. Affecting his LRQ. Lettuce and dairy are the culprits he stated that it usually affects him the next day.

## 2022-05-30 NOTE — Progress Notes (Signed)
Virtual Visit via Video Note  I connected with Billy Wilkerson on 05/30/22 at  1:00 PM EDT by a video enabled telemedicine application and verified that I am speaking with the correct person using two identifiers.   I discussed the limitations of evaluation and management by telemedicine and the availability of in person appointments. The patient expressed understanding and agreed to proceed.  Patient location: at home Provider location: in office  Subjective:    CC:   Chief Complaint  Patient presents with   Follow-up         HPI: Pt reports that he has been dealing with 3-4 months of stomach pain this happens more days than not. Pain can last a couple of days. Using Miralax the last 2 days.  No new medication. No changes to diet or regimen.  Ports does not probably drink enough water during the day.   Gas pain that turns into constipation. Affecting his LRQ. Lettuce and dairy are the culprits he stated that it usually affects him the next day. never really had this issue except for occ sour stomach.   Feels queasy and sometime doesn't want to eat. That happens often. This has been going on for years. Has never tried an acid medication.    Past medical history, Surgical history, Family history not pertinant except as noted below, Social history, Allergies, and medications have been entered into the medical record, reviewed, and corrections made.    Objective:    General: Speaking clearly in complete sentences without any shortness of breath.  Alert and oriented x3.  Normal judgment. No apparent acute distress.    Impression and Recommendations:    Problem List Items Addressed This Visit   None Visit Diagnoses     Constipation, unspecified constipation type    -  Primary   Nausea       Appetite loss          Constipation-unclear etiology I am not sure what started the soft 3 to 4 months ago it does not like he is tried any new medications he is not taking any  iron supplements, no major dietary changes.  We discussed continuing with the MiraLAX which she just started about 2 days ago until he has loose watery stools to sort of get a good cleanout.  Then he can decrease the MiraLAX maybe to every other day and then taper back off.  We did discuss that some people need to take this medication more regularly to keep the bowels moving.  But we will just have to see.  We discussed maybe even checking his thyroid once he is back in the local area.  He is hoping to be moving back to our area in August.  Nausea with decreased appetite-discussed could be GERD or gastritis.  Recommend at least a trial of a PPI for 2 to 3 weeks to see if it is helpful.  If it does help then we will do a full treatment for 6 weeks and then start on the over-the-counter version of a PPI.  No orders of the defined types were placed in this encounter.   Meds ordered this encounter  Medications   pantoprazole (PROTONIX) 40 MG tablet    Sig: Take 1 tablet (40 mg total) by mouth daily.    Dispense:  30 tablet    Refill:  1     I discussed the assessment and treatment plan with the patient. The patient was provided an opportunity  to ask questions and all were answered. The patient agreed with the plan and demonstrated an understanding of the instructions.   The patient was advised to call back or seek an in-person evaluation if the symptoms worsen or if the condition fails to improve as anticipated.   Beatrice Lecher, MD

## 2022-07-23 ENCOUNTER — Other Ambulatory Visit: Payer: Self-pay | Admitting: Family Medicine

## 2024-01-23 ENCOUNTER — Ambulatory Visit (INDEPENDENT_AMBULATORY_CARE_PROVIDER_SITE_OTHER): Payer: BC Managed Care – PPO | Admitting: Family Medicine

## 2024-01-23 VITALS — BP 130/75 | HR 76 | Ht 68.0 in | Wt 153.0 lb

## 2024-01-23 DIAGNOSIS — R21 Rash and other nonspecific skin eruption: Secondary | ICD-10-CM

## 2024-01-23 DIAGNOSIS — K219 Gastro-esophageal reflux disease without esophagitis: Secondary | ICD-10-CM | POA: Diagnosis not present

## 2024-01-23 MED ORDER — PANTOPRAZOLE SODIUM 40 MG PO TBEC: 40.0000 mg | DELAYED_RELEASE_TABLET | Freq: Every day | ORAL | 3 refills | Status: AC

## 2024-01-23 MED ORDER — TRIAMCINOLONE ACETONIDE 0.1 % EX CREA: 1.0000 | TOPICAL_CREAM | Freq: Two times a day (BID) | CUTANEOUS | 0 refills | Status: AC

## 2024-01-23 NOTE — Progress Notes (Signed)
Acute Office Visit  Subjective:     Patient ID: Billy Wilkerson, male    DOB: Dec 03, 2001, 23 y.o.   MRN: [id number]  Chief Complaint  Patient presents with   Rash    HPI Patient is in today for rash on right wrist and then moved to left wrist.  Then moved to the right side of his face along his beard area over the weekend. No changes to soap, detergents, diet, etc.  Used aquaphor initially. Using hydrocort at night.  He actually feels like that is helped and that the rash actually on his wrist looks a little better.  He did travel and spend the night in 2 different hotels about 2 weeks ago.  He says before it broke out he had noticed a cold sore on the right crease of his mouth and started to use some Abreva but it really was not helping so he just switched to Aquaphor because he felt like it was mostly just irritated dry skin.  He also had an aphthous ulcer underneath his tongue around that time and felt a little nodule underneath his chin.  He says initially it was tender but seems of gotten a little bit better but it is still present.  Was fasting with his church 20 days period and had avoided soda but then restarted soda.    The only other thing that he can think about is that he found a lamp skin jacket at the [employer] that he bought and he wore it right before everything broke out.  Also like a refill on his PPI that he uses for reflux.  ROS      Objective:    BP 130/75   Pulse 76   Ht 5\' 8"  (1.727 m)   Wt 153 lb (69.4 kg)   SpO2 98%   BMI 23.26 kg/m    Physical Exam Vitals reviewed.  Constitutional:      Appearance: Normal appearance.  HENT:     Head: Normocephalic.     Comments: I do palpate what feels like a small lymph node just under the triangle of the chin. Pulmonary:     Effort: Pulmonary effort is normal.  Skin:    Comments: Bright red erythematous papules on both posterior wrists and on the right side of his face over the beard area no other affected  places on his forearms arms or neck.  Neurological:     Mental Status: He is alert and oriented to person, place, and time.  Psychiatric:        Mood and Affect: Mood normal.        Behavior: Behavior normal.     No results found for any visits on 01/23/24.      Assessment & Plan:   Problem List Items Addressed This Visit       Digestive   GERD (gastroesophageal reflux disease)   Relevant Medications   pantoprazole (PROTONIX) 40 MG tablet   Other Visit Diagnoses       Localized papular rash    -  Primary   Relevant Medications   triamcinolone cream (KENALOG) 0.1 %      Rash -Unclear etiology most has the appearance of folliculitis.  No recent hot tub use.  Will go ahead and treat with triamcinolone since he is getting a partial response from an over-the-counter topical steroid.  If it does not completely resolve then please let me know.  Did travel recently but not in the typical area  I would expect to see bedbugs.  Underneath the chin is most consistent with a lymph node I did encourage him to keep an eye on it over the next couple weeks to make sure that it resolves.  Meds ordered this encounter  Medications   pantoprazole (PROTONIX) 40 MG tablet    Sig: Take 1 tablet (40 mg total) by mouth daily.    Dispense:  90 tablet    Refill:  3   triamcinolone cream (KENALOG) 0.1 %    Sig: Apply 1 Application topically 2 (two) times daily.    Dispense:  30 g    Refill:  0    No follow-ups on file.  Nani Gasser, MD

## 2024-01-28 ENCOUNTER — Encounter: Payer: Self-pay | Admitting: Family Medicine

## 2024-01-29 MED ORDER — PREDNISONE 20 MG PO TABS: 40.0000 mg | ORAL_TABLET | Freq: Every day | ORAL | 0 refills | Status: DC

## 2024-01-29 MED ORDER — PREDNISONE 20 MG PO TABS: 40.0000 mg | ORAL_TABLET | Freq: Every day | ORAL | 0 refills | Status: AC

## 2024-01-29 NOTE — Addendum Note (Signed)
Addended by: Nani Gasser D on: 01/29/2024 04:34 PM   Modules accepted: Orders
